# Patient Record
Sex: Female | Born: 1940 | Race: White | Hispanic: No | Marital: Married
Health system: Southern US, Community
[De-identification: ages and names within clinical notes are randomized; demographics above are authoritative.]

---

## 2008-12-21 ENCOUNTER — Inpatient Hospital Stay: Payer: Self-pay | Admitting: Orthopedic Surgery

## 2011-02-26 IMAGING — CR DG ANKLE COMPLETE 3+V*L*
1 series · 4 of 4 positions shown · non-contrast
Comparison: none

REASON FOR EXAM: left ankle deformity
COMMENTS:

[Series 1: view not recorded · 0.17mm/px · 4 of 4 slices shown]
[im 1/4]
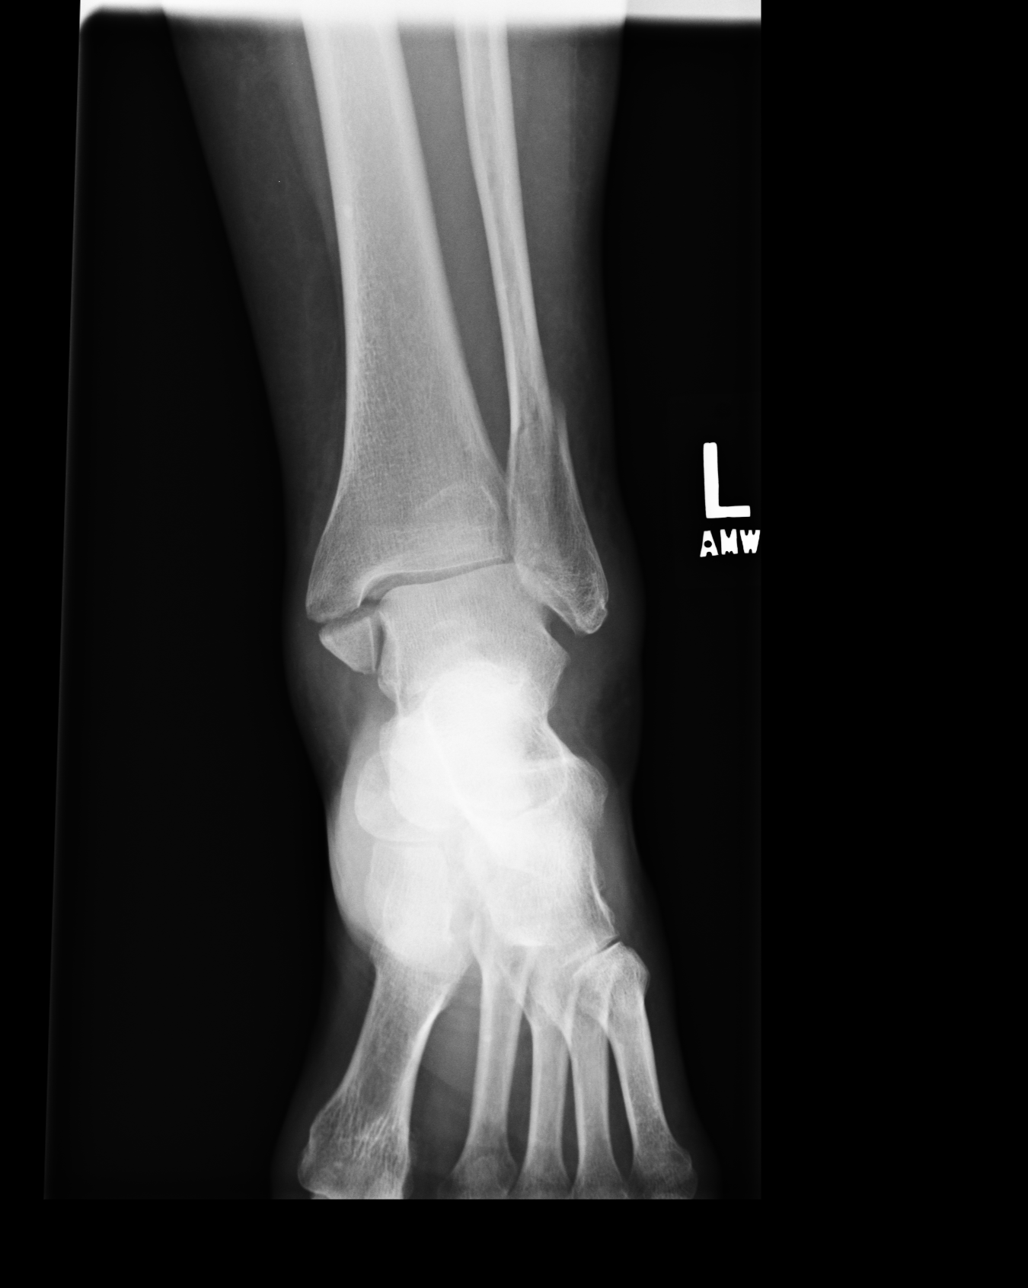
[im 2/4]
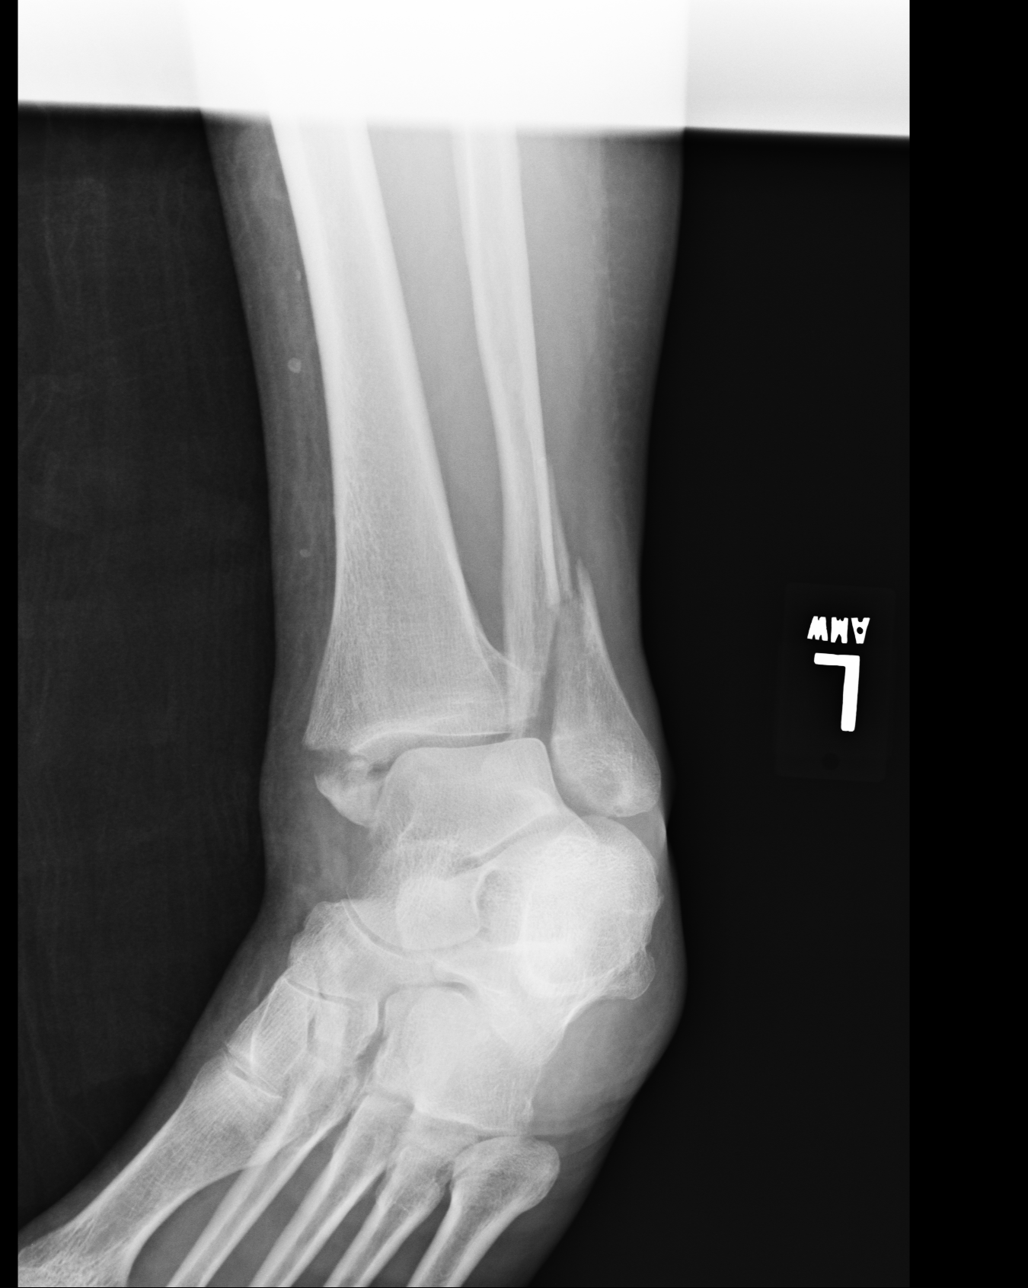
[im 3/4]
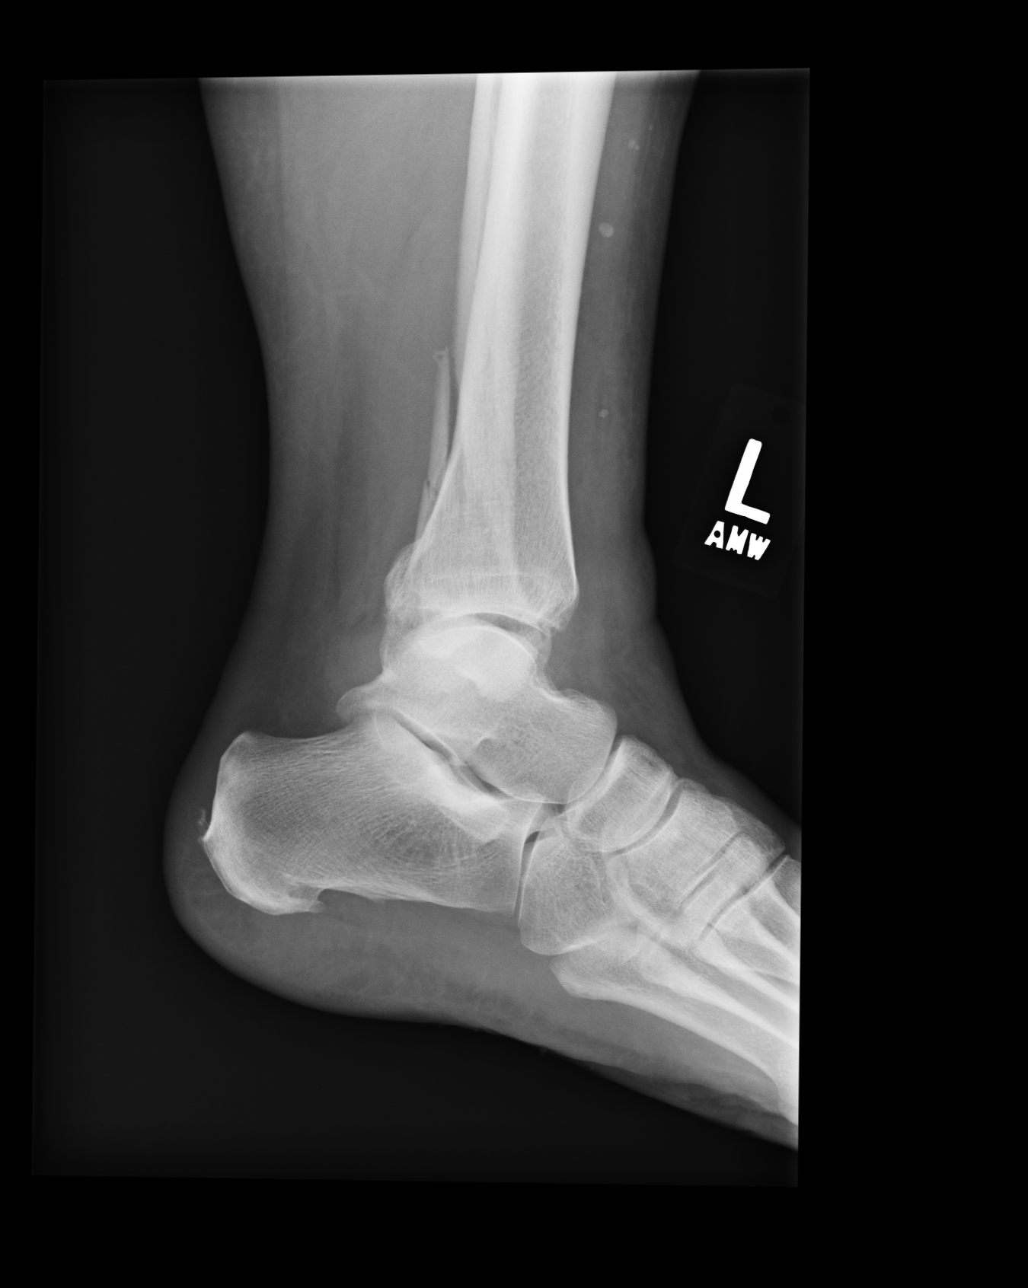
[im 4/4]
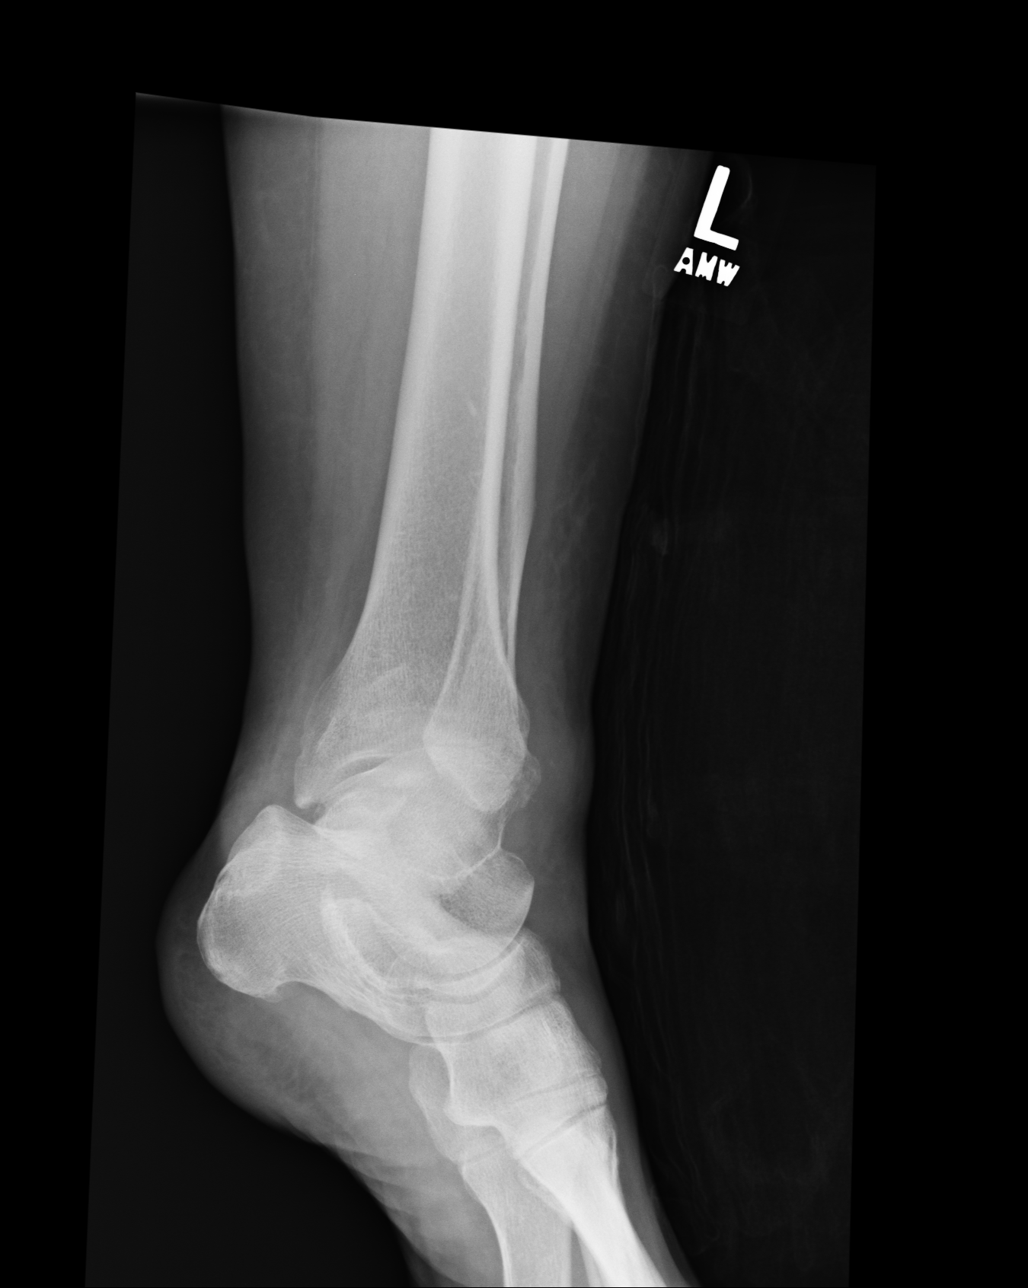

[4 of 4 positions shown; findings below may reference images not displayed]

PROCEDURE:     DXR - DXR ANKLE LEFT COMPLETE  - December 21, 2008  [DATE]

RESULT:     There is a bimalleolar fracture of the left ankle. There is
distraction of the horizontally oriented medial malleolar fracture by
approximately 5 mm. The spinal fracture of the distal fibular metaphysis is
distracted as well. The ankle joint mortise is disrupted. There is no
evidence of a displaced posterior malleolar fracture but I cannot exclude
subtle cortical irregularity here reflecting a nondisplaced fracture.
IMPRESSION: There is a bimalleolar fracture of the left ankle. I cannot
absolutely exclude the possibility of a small area of a avulsion from the
posterior malleolus. There is disruption of the ankle joint mortise.

## 2011-02-26 IMAGING — CR DG CHEST 1V PORT
1 series · 1 of 1 positions shown · non-contrast
Comparison: none

REASON FOR EXAM: preop
COMMENTS:

PROCEDURE:     DXR - DXR PORTABLE CHEST SINGLE VIEW  - December 21, 2008  [DATE]
RESULT:     The lungs are adequately inflated. There is no focal infiltrate.
The central pulmonary vascularity is prominent. The cardiac silhouette is
not enlarged. The pulmonary vascularity does not appear engorged.

[view not recorded]
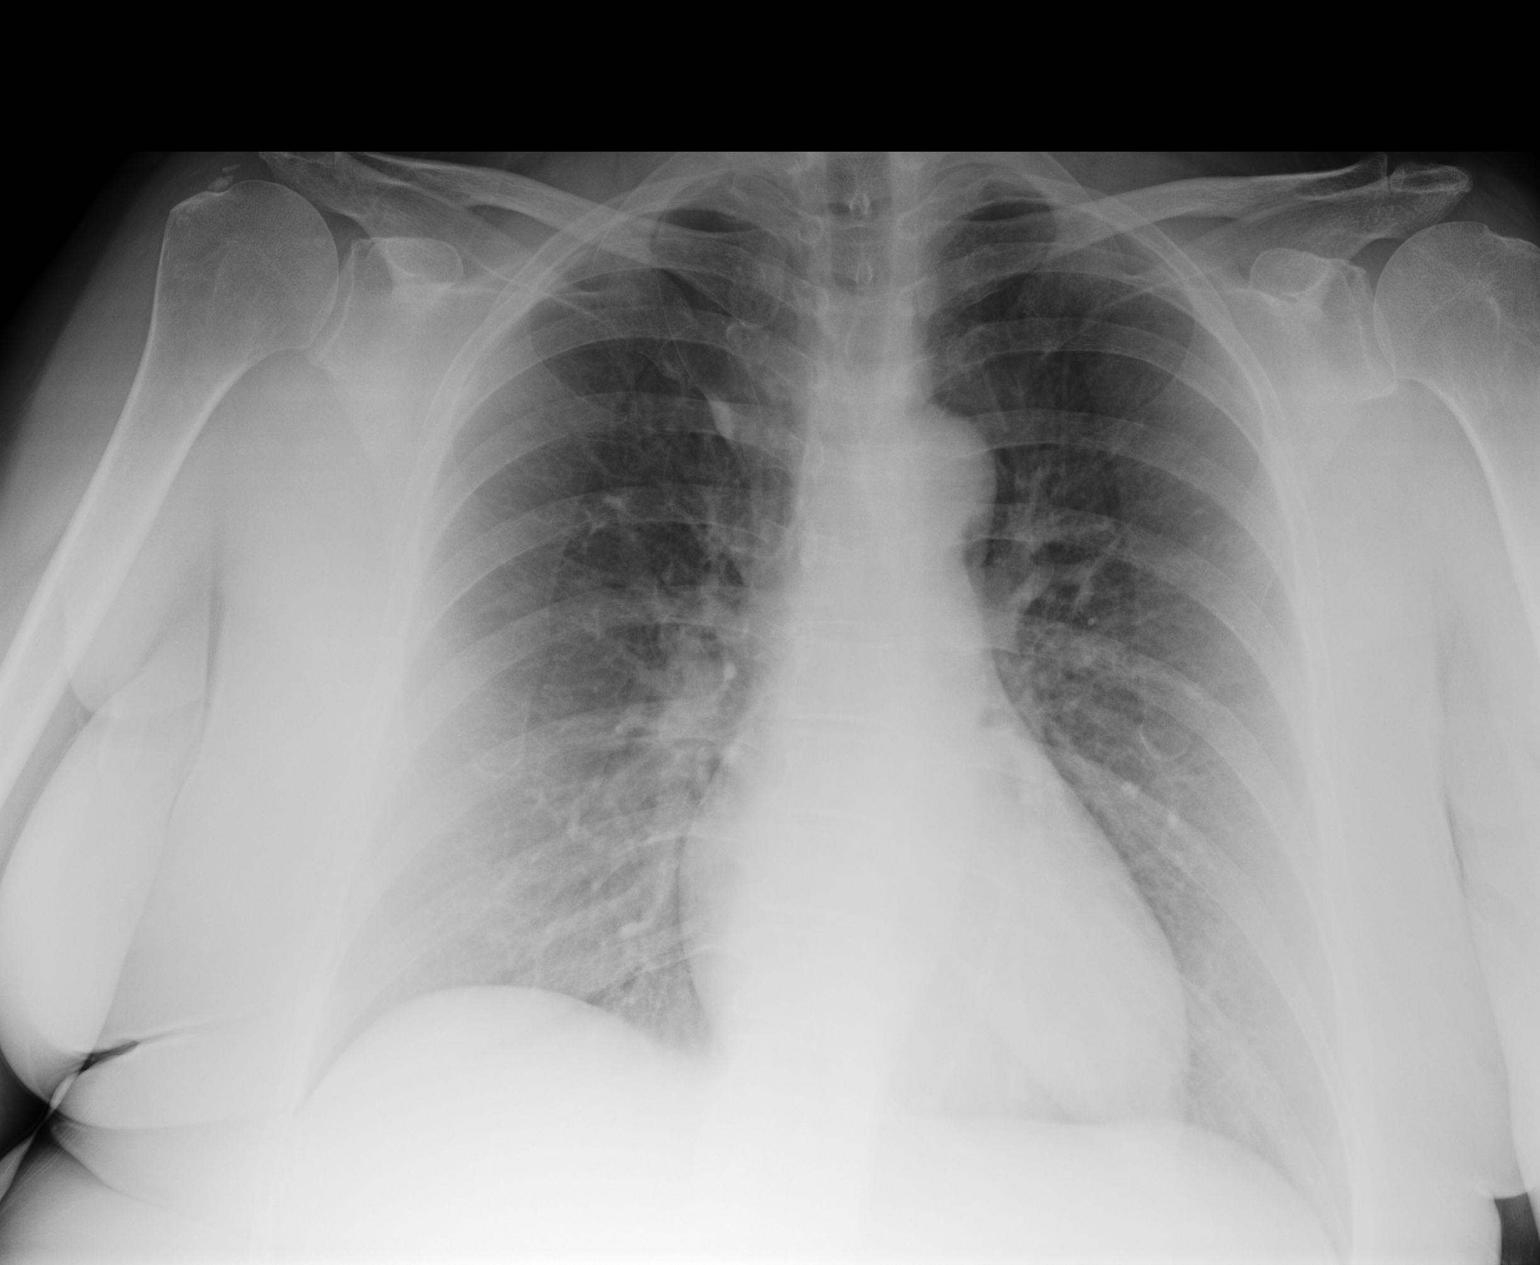

[1 of 1 positions shown; findings below may reference images not displayed]

IMPRESSION: There are mildly increased perihilar lung markings which
may be normal for the patient or which could reflect mild peribronchial
cuffing or subsegmental atelectasis. There is no focal pneumonia nor
evidence of CHF.

## 2011-02-28 IMAGING — CR DG ANKLE 2V *L*
1 series · 2 of 2 positions shown · non-contrast
Comparison: none

REASON FOR EXAM: pain
COMMENTS:

[Series 1: view not recorded · 0.17mm/px · 2 of 2 slices shown]
[im 1/2]
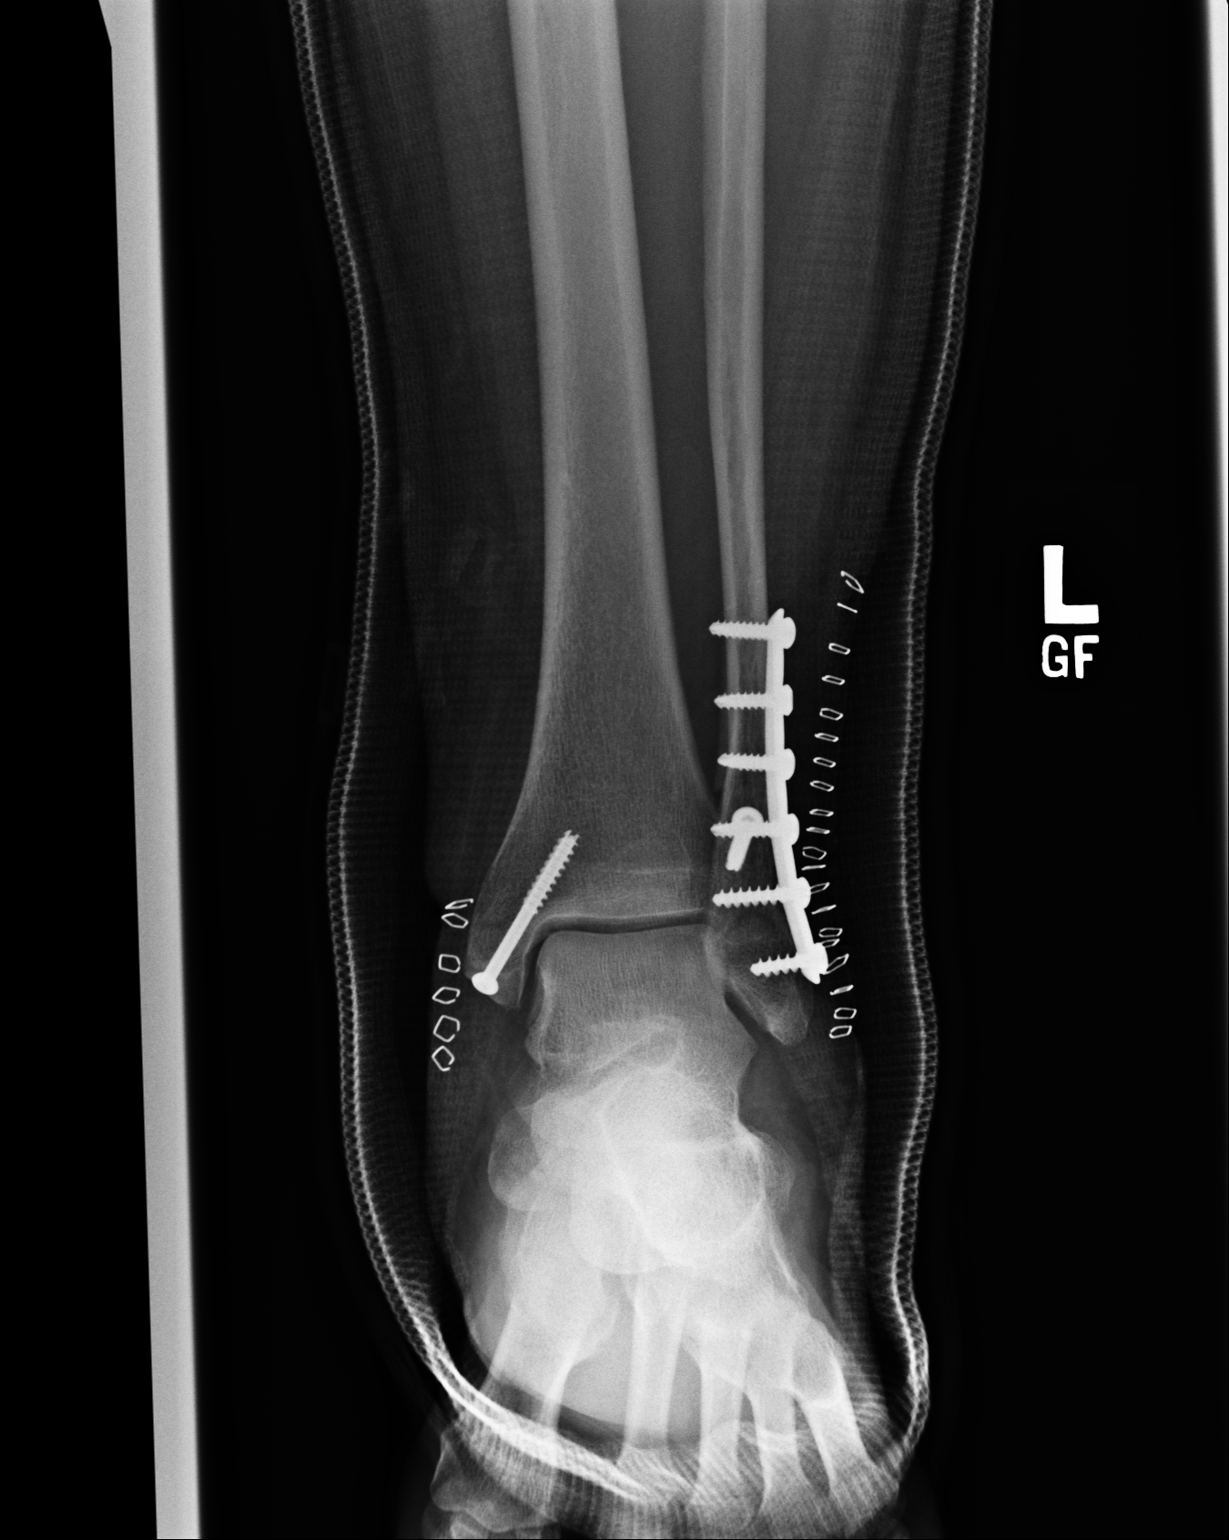
[im 2/2]
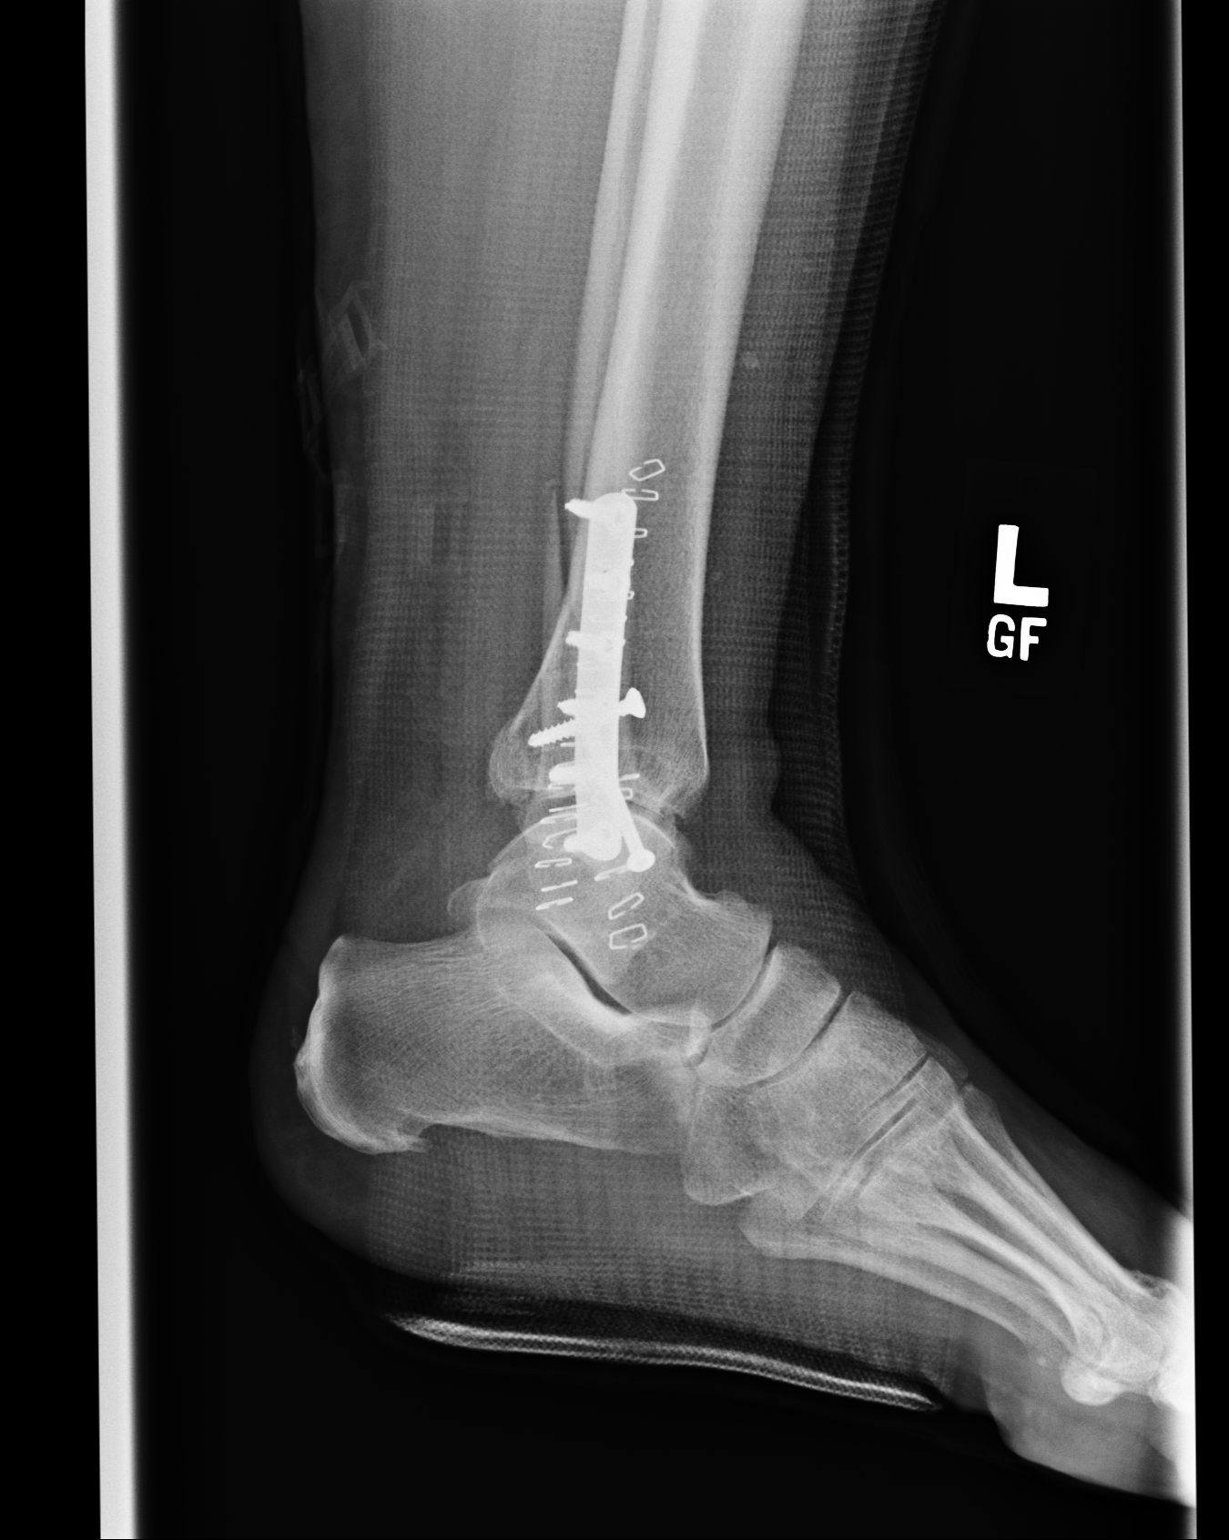

[2 of 2 positions shown; findings below may reference images not displayed]

PROCEDURE:     DXR - DXR ANKLE LEFT AP AND LATERAL  - December 23, 2008  [DATE]

RESULT:     Comparison is made to study 22 December, 2008.

In plaster views of the left ankle are obtained. The patient has undergone
prior ORIF for a distal fibular fracture and medial malleolar fracture.
Alignment of the fracture fragments appears near-anatomic. I do not see
evidence of an acute refracture.
IMPRESSION: I do not see evidence of acute refracture of the left
ankle. The fracture lines of the distal fibular fracture remain evident on
the lateral film. There is subtle lucency through the medial malleolus in a
horizontal fashion that reflects the previous fracture line but it appears
to be well approximated with the donor site.

## 2013-03-23 ENCOUNTER — Ambulatory Visit: Payer: Self-pay | Admitting: Orthopedic Surgery

## 2013-04-04 ENCOUNTER — Ambulatory Visit: Payer: Self-pay | Admitting: Orthopedic Surgery

## 2014-08-24 NOTE — Op Note (Signed)
PATIENT NAME:  Rita Morrow, Rita Morrow MR#:  [id number] DATE OF BIRTH:  12-27-1940  DATE OF PROCEDURE:  04/04/2013  PREOPERATIVE DIAGNOSIS: Left ankle painful hardware, medial and lateral ankle.   POSTOPERATIVE DIAGNOSIS:  Left ankle painful hardware, medial and lateral ankle.   PROCEDURE: Removal of deep hardware, medial and lateral ankle, left.   ANESTHESIA:  General.   SURGEON: Kennedy BuckerMichael Leamon Palau, M.D.   DESCRIPTION OF PROCEDURE: The patient was brought to the operating room and after adequate anesthesia was obtained, the left leg was prepped and draped in the usual sterile fashion with tourniquet applied to the proximal leg. After patient identification and timeout procedures were completed and having prepped and draped the leg, tourniquet was raised to 275 mmHg. The lateral incision was opened first with incision through the prior incision. The plate was easily exposed with some bursal tissue present over the prominent screws. The anterior to posterior interlocking lag screw was then identified and removed without difficulty. All screws were removed from the plate, and the plate was removed. There was some bony growth around the screw holes and this was debrided with use of a rongeur. The wound was then irrigated and attention turned to the medial ankle. Portion of the prior medial ankle incision was opened with the mini C-arm to aid in localization. The prior medial malleolar screw was identified, the head  exposed and the screw removed without difficulty. The wounds were both then irrigated and closed with 2-0 Vicryl subcutaneously and skin staples. Xeroform, 4 x 4's, Webril and Ace wrap applied. Tourniquet time was 18 minutes at 275 mmHg. There are no complications. No specimen.   FINDINGS: Healed fracture. The hardware was discarded after picture taken. There were no problems with the prior hardware. It was just prominent.     ____________________________ Leitha SchullerMichael J. Etheleen Valtierra, MD mjm:dmm D: 04/04/2013  22:49:16 ET T: 04/04/2013 22:56:25 ET JOB#: [id number]  cc: Leitha SchullerMichael J. Delara Shepheard, MD, <Dictator> Leitha SchullerMICHAEL J Omri Bertran MD ELECTRONICALLY SIGNED 04/05/2013 8:13
